# Patient Record
Sex: Female | Born: 2000
Health system: Southern US, Community
[De-identification: ages and names within clinical notes are randomized; demographics above are authoritative.]

## PROBLEM LIST (undated history)

## (undated) DIAGNOSIS — R55 Syncope and collapse: Secondary | ICD-10-CM

## (undated) DIAGNOSIS — F419 Anxiety disorder, unspecified: Secondary | ICD-10-CM

## (undated) DIAGNOSIS — R110 Nausea: Secondary | ICD-10-CM

## (undated) DIAGNOSIS — M419 Scoliosis, unspecified: Secondary | ICD-10-CM

## (undated) DIAGNOSIS — Z8489 Family history of other specified conditions: Secondary | ICD-10-CM

## (undated) DIAGNOSIS — R51 Headache: Secondary | ICD-10-CM

## (undated) DIAGNOSIS — R519 Headache, unspecified: Secondary | ICD-10-CM

## (undated) DIAGNOSIS — H539 Unspecified visual disturbance: Secondary | ICD-10-CM

## (undated) HISTORY — DX: Anxiety disorder, unspecified: F41.9

## (undated) HISTORY — DX: Syncope and collapse: R55

## (undated) HISTORY — DX: Nausea: R11.0

## (undated) HISTORY — PX: TYMPANOSTOMY TUBE PLACEMENT: SHX32

---
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
## 2020-03-06 ENCOUNTER — Telehealth (INDEPENDENT_AMBULATORY_CARE_PROVIDER_SITE_OTHER): Payer: 59 | Admitting: Psychiatry

## 2020-03-06 ENCOUNTER — Other Ambulatory Visit: Payer: Self-pay

## 2020-03-06 DIAGNOSIS — F411 Generalized anxiety disorder: Secondary | ICD-10-CM

## 2020-03-06 MED ORDER — CITALOPRAM HYDROBROMIDE 20 MG PO TABS: 30.0000 mg | ORAL_TABLET | Freq: Every day | ORAL | 1 refills | Status: DC

## 2020-03-06 NOTE — Progress Notes (Signed)
BH MD/PA/NP OP Progress Note  03/06/2020 11:07 AM Melissa Mcknight  MRN:  [id number] Interview was conducted by phone and I verified that I was speaking with the correct person using two identifiers. I discussed the limitations of evaluation and management by telemedicine and  the availability of in person appointments. Patient expressed understanding and agreed to proceed.Patient location - home; physican - home office.  Chief Complaint: "I am well".  HPI: 19 yo single white femalewitha hx of panic attacks and generalized/social anxiety. These likely contributed to tension headaches she experienced in the past but not anymore. Anxiety has been under good control for a while now after addition of citalopram. Laketta has been feeling stable andpreviouslyexpressed interest in coming off the medication.This however has changedafter she hasstarted [name] pre-medbut due to COVID-19 all courses were given on-line. In he Fall they will be in person. She is no longer in counseling and still feels that it is not the right time to come off citalopram. She denies feeling depressed and hersleep and appetite are good.    Visit Diagnosis:    ICD-10-CM   1. GAD (generalized anxiety disorder)  F41.1     Past Psychiatric History: Plse see itakp.  Past Medical History:  Past Medical History:  Diagnosis Date  . Family history of adverse reaction to anesthesia    MGM - N/V and hard time com,ing out off it  . Headache   . Nausea   . Scoliosis    miminal   . Vision abnormalities    wears glasses    Past Surgical History:  Procedure Laterality Date  . ESOPHAGOGASTRODUODENOSCOPY N/A 10/20/2016   Procedure: ESOPHAGOGASTRODUODENOSCOPY (EGD);  Surgeon: Adelene Amas, MD;  Location: [facility] ENDOSCOPY;  Service: Gastroenterology;  Laterality: N/A;  . TYMPANOSTOMY TUBE PLACEMENT      Family Psychiatric History: Reviewed.  Family History:  Family History  Problem Relation Age of Onset  .  Depression Mother   . Anxiety disorder Mother   . Hyperlipidemia Father   . Arthritis Maternal Grandmother   . Asthma Maternal Grandmother   . Depression Maternal Grandmother   . Diabetes Maternal Grandmother   . Hypertension Maternal Grandmother   . Anxiety disorder Maternal Grandmother   . Arthritis Maternal Grandfather   . COPD Maternal Grandfather   . Diabetes Maternal Grandfather   . Hearing loss Maternal Grandfather   . Heart disease Maternal Grandfather   . Arthritis Paternal Grandmother   . Hyperlipidemia Paternal Grandmother   . Arthritis Paternal Grandfather   . Diabetes Paternal Grandfather   . Heart disease Paternal Grandfather   . Hypertension Paternal Grandfather   . Anxiety disorder Paternal Aunt   . Anxiety disorder Cousin     Social History:  Social History   Socioeconomic History  . Marital status: Single    Spouse name: Not on file  . Number of children: 0  . Years of education: Not on file  . Highest education level: Not on file  Occupational History  . Occupation: Dentist: [employer]  Tobacco Use  . Smoking status: Never Smoker  . Smokeless tobacco: Never Used  Substance and Sexual Activity  . Alcohol use: No  . Drug use: No  . Sexual activity: Never  Other Topics Concern  . 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   She attends [employer].   She lives with both parents and her two sisters.  She enjoys her dogs, reading.   Social Determinants of Health   Financial Resource Strain:   . Difficulty of Paying Living Expenses:   Food Insecurity:   . Worried About Charity fundraiser in the Last Year:   . Arboriculturist in the Last Year:   Transportation Needs:   . Film/video editor (Medical):   Marland Kitchen Lack of Transportation (Non-Medical):   Physical Activity:   . Days of Exercise per Week:   . Minutes of Exercise per Session:   Stress:   . Feeling of Stress :   Social  Connections:   . Frequency of Communication with Friends and Family:   . Frequency of Social Gatherings with Friends and Family:   . Attends Religious Services:   . Active Member of Clubs or Organizations:   . Attends Archivist Meetings:   Marland Kitchen Marital Status:     Allergies: No Known Allergies  Metabolic Disorder Labs: No results found for: HGBA1C, MPG No results found for: PROLACTIN No results found for: CHOL, TRIG, HDL, CHOLHDL, VLDL, LDLCALC No results found for: TSH  Therapeutic Level Labs: No results found for: LITHIUM No results found for: VALPROATE No components found for:  CBMZ  Current Medications: Current Outpatient Medications  Medication Sig Dispense Refill  . [START ON 05/27/2020] citalopram (CELEXA) 20 MG tablet Take 1.5 tablets (30 mg total) by mouth daily. 135 tablet 1  . clindamycin-benzoyl peroxide (BENZACLIN) gel Apply topically 2 (two) times daily.     No current facility-administered medications for this visit.     Psychiatric Specialty Exam: Review of Systems  All other systems reviewed and are negative.   There were no vitals taken for this visit.There is no height or weight on file to calculate BMI.  General Appearance: NA  Eye Contact:  NA  Speech:  Clear and Coherent and Normal Rate  Volume:  Normal  Mood:  Euthymic  Affect:  NA  Thought Process:  Goal Directed and Linear  Orientation:  Full (Time, Place, and Person)  Thought Content: Logical   Suicidal Thoughts:  No  Homicidal Thoughts:  No  Memory:  Immediate;   Good Recent;   Good Remote;   Good  Judgement:  Good  Insight:  Good  Psychomotor Activity:  NA  Concentration:  Concentration: Good  Recall:  Good  Fund of Knowledge: Good  Language: Good  Akathisia:  Negative  Handed:  Right  AIMS (if indicated): not done  Assets:  Communication Skills Desire for Improvement Financial Resources/Insurance Housing Physical Health Social  Support Talents/Skills Vocational/Educational  ADL's:  Intact  Cognition: WNL  Sleep:  Good   Assessment and Plan:  19 yo single white femalewitha hx of panic attacks and generalized/social anxiety. These likely contributed to tension headaches she experienced in the past but not anymore. Anxiety has been under good control for a while now after addition of citalopram. Ticia has been feeling stable andpreviouslyexpressed interest in coming off the medication.This however has changedafter she hasstarted [employer] pre-medbut due to COVID-19 all courses were given on-line. In he Fall they will be in person. She is no longer in counseling and still feels that it is not the right time to come off citalopram. She denies feeling depressed and hersleep and appetite are good.   Dx: GAD  Plan: Continue citalopram 30 mg for now.We plan to have a next visit in 3 months.The plan was discussed with patient who had an opportunity to ask  questions and these were all answered.I spend15 minutes inphone consultation with the patient.    Magdalene Patricia, MD 03/06/2020, 11:07 AM

## 2020-06-07 ENCOUNTER — Other Ambulatory Visit: Payer: Self-pay

## 2020-06-07 ENCOUNTER — Telehealth (INDEPENDENT_AMBULATORY_CARE_PROVIDER_SITE_OTHER): Payer: 59 | Admitting: Psychiatry

## 2020-06-07 DIAGNOSIS — F411 Generalized anxiety disorder: Secondary | ICD-10-CM

## 2020-06-07 NOTE — Progress Notes (Signed)
BH MD/PA/NP OP Progress Note  06/07/2020 9:39 AM Melissa Mcknight  MRN:  [id number] Interview was conducted by phone and I verified that I was speaking with the correct person using two identifiers. I discussed the limitations of evaluation and management by telemedicine and  the availability of in person appointments. Patient expressed understanding and agreed to proceed. Patient location - home; physician - home office.  Chief Complaint: None.  HPI: 19 yo single white femalewitha hx of panic attacks and generalized/social anxiety. These likely contributed to tension headaches she experienced in the past but not anymore. Anxiety has been under good control for a while now after addition of citalopram. Melissa Mcknight has been feeling stable andpreviouslyexpressed interest in coming off the medication.This however has changedafter she hasstarted [name] pre-medbut due to COVID-19 all courses weregiven on-line. She is now a sophomore (to major  In biology), is in [employer] as most classes are held in person. She has been vaccinated against COVID and plans to get flue vaccine as well. She had brief increase in anxiety level at the beginning of semester but it "is normal now". She denies feeling depressed and hersleep and appetite are good.    Visit Diagnosis:    ICD-10-CM   1. GAD (generalized anxiety disorder)  F41.1     Past Psychiatric History: Please see intake H&P.  Past Medical History:  Past Medical History:  Diagnosis Date  . Family history of adverse reaction to anesthesia    MGM - N/V and hard time com,ing out off it  . Headache   . Nausea   . Scoliosis    miminal   . Vision abnormalities    wears glasses    Past Surgical History:  Procedure Laterality Date  . ESOPHAGOGASTRODUODENOSCOPY N/A 10/20/2016   Procedure: ESOPHAGOGASTRODUODENOSCOPY (EGD);  Surgeon: Adelene Amas, MD;  Location: [facility] ENDOSCOPY;  Service: Gastroenterology;  Laterality: N/A;  . TYMPANOSTOMY  TUBE PLACEMENT      Family Psychiatric History: Reviewed.  Family History:  Family History  Problem Relation Age of Onset  . Depression Mother   . Anxiety disorder Mother   . Hyperlipidemia Father   . Arthritis Maternal Grandmother   . Asthma Maternal Grandmother   . Depression Maternal Grandmother   . Diabetes Maternal Grandmother   . Hypertension Maternal Grandmother   . Anxiety disorder Maternal Grandmother   . Arthritis Maternal Grandfather   . COPD Maternal Grandfather   . Diabetes Maternal Grandfather   . Hearing loss Maternal Grandfather   . Heart disease Maternal Grandfather   . Arthritis Paternal Grandmother   . Hyperlipidemia Paternal Grandmother   . Arthritis Paternal Grandfather   . Diabetes Paternal Grandfather   . Heart disease Paternal Grandfather   . Hypertension Paternal Grandfather   . Anxiety disorder Paternal Aunt   . Anxiety disorder Cousin     Social History:  Social History   Socioeconomic History  . Marital status: Single    Spouse name: Not on file  . Number of children: 0  . Years of education: Not on file  . Highest education level: Not on file  Occupational History  . Occupation: Dentist: [employer]  Tobacco Use  . Smoking status: Never Smoker  . Smokeless tobacco: Never Used  Vaping Use  . Vaping Use: Never used  Substance and Sexual Activity  . Alcohol use: No  . Drug use: No  . Sexual activity: Never  Other Topics Concern  . 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   She attends [employer].   She lives with both parents and her two sisters.   She enjoys her dogs, reading.   Social Determinants of Health   Financial Resource Strain:   . Difficulty of Paying Living Expenses: Not on file  Food Insecurity:   . Worried About Programme researcher, broadcasting/film/video in the Last Year: Not on file  . Ran Out of Food in the Last Year: Not on file  Transportation Needs:   . Lack of  Transportation (Medical): Not on file  . Lack of Transportation (Non-Medical): Not on file  Physical Activity:   . Days of Exercise per Week: Not on file  . Minutes of Exercise per Session: Not on file  Stress:   . Feeling of Stress : Not on file  Social Connections:   . Frequency of Communication with Friends and Family: Not on file  . Frequency of Social Gatherings with Friends and Family: Not on file  . Attends Religious Services: Not on file  . Active Member of Clubs or Organizations: Not on file  . Attends Banker Meetings: Not on file  . Marital Status: Not on file    Allergies: No Known Allergies  Metabolic Disorder Labs: No results found for: HGBA1C, MPG No results found for: PROLACTIN No results found for: CHOL, TRIG, HDL, CHOLHDL, VLDL, LDLCALC No results found for: TSH  Therapeutic Level Labs: No results found for: LITHIUM No results found for: VALPROATE No components found for:  CBMZ  Current Medications: Current Outpatient Medications  Medication Sig Dispense Refill  . citalopram (CELEXA) 20 MG tablet Take 1.5 tablets (30 mg total) by mouth daily. 135 tablet 1  . clindamycin-benzoyl peroxide (BENZACLIN) gel Apply topically 2 (two) times daily.     No current facility-administered medications for this visit.    Psychiatric Specialty Exam: Review of Systems  All other systems reviewed and are negative.   There were no vitals taken for this visit.There is no height or weight on file to calculate BMI.  General Appearance: NA  Eye Contact:  NA  Speech:  Clear and Coherent and Normal Rate  Volume:  Normal  Mood:  Euthymic  Affect:  NA  Thought Process:  Goal Directed and Linear  Orientation:  Full (Time, Place, and Person)  Thought Content: Logical   Suicidal Thoughts:  No  Homicidal Thoughts:  No  Memory:  Immediate;   Good Recent;   Good Remote;   Good  Judgement:  Good  Insight:  Good  Psychomotor Activity:  NA  Concentration:   Concentration: Good and Attention Span: Good  Recall:  Good  Fund of Knowledge: Good  Language: Good  Akathisia:  Negative  Handed:  Right  AIMS (if indicated): not done  Assets:  Communication Skills Desire for Improvement Financial Resources/Insurance Housing Physical Health Social Support Vocational/Educational  ADL's:  Intact  Cognition: WNL  Sleep:  Good    Assessment and Plan: 19 yo single white femalewitha hx of panic attacks and generalized/social anxiety. These likely contributed to tension headaches she experienced in the past but not anymore. Anxiety has been under good control for a while now after addition of citalopram. Samira has been feeling stable andpreviouslyexpressed interest in coming off the medication.This however has changedafter she hasstarted [name] pre-medbut due to COVID-19 all courses weregiven on-line. She is now a sophomore (to major  In biology), is in  [place] as most classes are held in person. She has been vaccinated against COVID and plans to get flue vaccine as well. She had brief increase in anxiety level at the beginning of semester but it "is normal now". She denies feeling depressed and hersleep and appetite are good.   Dx: GAD  Plan: Continue citalopram 30 mg for now.We plan to have a next visit in 3 months.The plan was discussed with patient who had an opportunity to ask questions and these were all answered.I spend45minutes inphone consultation with the patient.   Magdalene Patricia, MD 06/07/2020, 9:39 AM

## 2020-09-10 ENCOUNTER — Telehealth (INDEPENDENT_AMBULATORY_CARE_PROVIDER_SITE_OTHER): Payer: 59 | Admitting: Psychiatry

## 2020-09-10 ENCOUNTER — Other Ambulatory Visit: Payer: Self-pay

## 2020-09-10 DIAGNOSIS — F411 Generalized anxiety disorder: Secondary | ICD-10-CM | POA: Diagnosis not present

## 2020-09-10 MED ORDER — CITALOPRAM HYDROBROMIDE 20 MG PO TABS: 30.0000 mg | ORAL_TABLET | Freq: Every day | ORAL | 1 refills | Status: DC

## 2020-09-10 NOTE — Progress Notes (Signed)
BH MD/PA/NP OP Progress Note  09/10/2020 9:37 AM Melissa Mcknight  MRN:  [id number] Interview was conducted by phone and I verified that I was speaking with the correct person using two identifiers. I discussed the limitations of evaluation and management by telemedicine and  the availability of in person appointments. Patient expressed understanding and agreed to proceed. Participants in the visit: patient (location - home); physician (location - home office).  Chief Complaint: "I am well".  HPI: 19yo single white femalewitha hx of panic attacks and generalized/social anxiety. These likely contributed to tension headaches she experienced in the past but not anymore. Anxiety has been under good control for a while now after addition of citalopram. Melissa Mcknight has been feeling stable andpreviouslyexpressed interest in coming off the medication.This however has changedafter she hasstarted [employer] where she is now a sophomore (to major in biology).  She had brief increase in anxiety level at the beginning of semester but it "is normal now". She denies feeling depressed and hersleep and appetite are good.    Visit Diagnosis:    ICD-10-CM   1. GAD (generalized anxiety disorder)  F41.1     Past Psychiatric History: Please see intake H&P.  Past Medical History:  Past Medical History:  Diagnosis Date  . Family history of adverse reaction to anesthesia    MGM - N/V and hard time com,ing out off it  . Headache   . Nausea   . Scoliosis    miminal   . Vision abnormalities    wears glasses    Past Surgical History:  Procedure Laterality Date  . ESOPHAGOGASTRODUODENOSCOPY N/A 10/20/2016   Procedure: ESOPHAGOGASTRODUODENOSCOPY (EGD);  Surgeon: Adelene Amas, MD;  Location[facility] ENDOSCOPY;  Service: Gastroenterology;  Laterality: N/A;  . TYMPANOSTOMY TUBE PLACEMENT      Family Psychiatric History: Reviewed.  Family History:  Family History  Problem Relation Age of Onset  .  Depression Mother   . Anxiety disorder Mother   . Hyperlipidemia Father   . Arthritis Maternal Grandmother   . Asthma Maternal Grandmother   . Depression Maternal Grandmother   . Diabetes Maternal Grandmother   . Hypertension Maternal Grandmother   . Anxiety disorder Maternal Grandmother   . Arthritis Maternal Grandfather   . COPD Maternal Grandfather   . Diabetes Maternal Grandfather   . Hearing loss Maternal Grandfather   . Heart disease Maternal Grandfather   . Arthritis Paternal Grandmother   . Hyperlipidemia Paternal Grandmother   . Arthritis Paternal Grandfather   . Diabetes Paternal Grandfather   . Heart disease Paternal Grandfather   . Hypertension Paternal Grandfather   . Anxiety disorder Paternal Aunt   . Anxiety disorder Cousin     Social History:  Social History   Socioeconomic History  . Marital status: Single    Spouse name: Not on file  . Number of children: 0  . Years of education: Not on file  . Highest education level: Not on file  Occupational History  . Occupation: Dentist: [employer]  Tobacco Use  . Smoking status: Never Smoker  . Smokeless tobacco: Never Used  Vaping Use  . Vaping Use: Never used  Substance and Sexual Activity  . Alcohol use: No  . Drug use: No  . Sexual activity: Never  Other Topics Concern  . 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   She attends [employer].   She lives with both  parents and her two sisters.   She enjoys her dogs, reading.   Social Determinants of Health   Financial Resource Strain: Not on file  Food Insecurity: Not on file  Transportation Needs: Not on file  Physical Activity: Not on file  Stress: Not on file  Social Connections: Not on file    Allergies: No Known Allergies  Metabolic Disorder Labs: No results found for: HGBA1C, MPG No results found for: PROLACTIN No results found for: CHOL, TRIG, HDL, CHOLHDL, VLDL, LDLCALC No  results found for: TSH  Therapeutic Level Labs: No results found for: LITHIUM No results found for: VALPROATE No components found for:  CBMZ  Current Medications: Current Outpatient Medications  Medication Sig Dispense Refill  . citalopram (CELEXA) 20 MG tablet Take 1.5 tablets (30 mg total) by mouth daily. 135 tablet 1  . clindamycin-benzoyl peroxide (BENZACLIN) gel Apply topically 2 (two) times daily.     No current facility-administered medications for this visit.    Psychiatric Specialty Exam: Review of Systems  All other systems reviewed and are negative.   There were no vitals taken for this visit.There is no height or weight on file to calculate BMI.  General Appearance: NA  Eye Contact:  NA  Speech:  Clear and Coherent and Normal Rate  Volume:  Normal  Mood:  Euthymic  Affect:  NA  Thought Process:  Goal Directed and Linear  Orientation:  Other:  x 4  Thought Content: Logical   Suicidal Thoughts:  No  Homicidal Thoughts:  No  Memory:  Immediate;   Good Recent;   Good Remote;   Good  Judgement:  Good  Insight:  Good  Psychomotor Activity:  NA  Concentration:  Concentration: Good and Attention Span: Good  Recall:  Good  Fund of Knowledge: Good  Language: Good  Akathisia:  Negative  Handed:  Right  AIMS (if indicated): not done  Assets:  Communication Skills Desire for Improvement Financial Resources/Insurance Housing Social Support Vocational/Educational  ADL's:  Intact  Cognition: WNL  Sleep:  Good    Assessment and Plan: 19yo single white femalewitha hx of panic attacks and generalized/social anxiety. These likely contributed to tension headaches she experienced in the past but not anymore. Anxiety has been under good control for a while now after addition of citalopram. Melissa Mcknight has been feeling stable andpreviouslyexpressed interest in coming off the medication.This however has changedafter she hasstarted [employer] where she is now  a sophomore (to major in biology).  She had brief increase in anxiety level at the beginning of semester but it "is normal now". She denies feeling depressed and hersleep and appetite are good.   Dx: GAD  Plan: Continue citalopram 30 mg.We plan to have a next visit in 3 months.The plan was discussed with patient who had an opportunity to ask questions and these were all answered.I spend19minutes inphone consultation with the patient.   Magdalene Patricia, MD 09/10/2020, 9:37 AM

## 2020-11-11 ENCOUNTER — Other Ambulatory Visit: Payer: Self-pay

## 2020-11-11 ENCOUNTER — Telehealth (INDEPENDENT_AMBULATORY_CARE_PROVIDER_SITE_OTHER): Payer: 59 | Admitting: Psychiatry

## 2020-11-11 DIAGNOSIS — F411 Generalized anxiety disorder: Secondary | ICD-10-CM

## 2020-11-11 MED ORDER — CITALOPRAM HYDROBROMIDE 20 MG PO TABS: 30.0000 mg | ORAL_TABLET | Freq: Every day | ORAL | 1 refills | Status: AC

## 2020-11-11 NOTE — Progress Notes (Signed)
BH MD/PA/NP OP Progress Note  11/11/2020 1:11 PM Melissa Mcknight  MRN:  [id number] Interview was conducted using videoconferencing application and I verified that I was speaking with the correct person using two identifiers. I discussed the limitations of evaluation and management by telemedicine and  the availability of in person appointments. Patient expressed understanding and agreed to proceed. Participants in the visit: patient (location - home); physician (location - home office).  Chief Complaint: None.  HPI: 19yo single white femalewitha hx of panic attacks and generalized/social anxiety. These likely contributed to tension headaches she experienced in the past but not anymore. Anxiety has been under good control for a while now after addition of citalopram. Melissa Mcknight has been feeling stable andpreviouslyexpressed interest in coming off the medication.This however has changedafter she hasstarted [employer] where she is now a sophomore (to major in biology).  She has brief periods of increased anxiety (1-2 days) in response to stressors at school - academic - but no significant academic problems. These seem to be normal adjustment reactions to stress.  She denies feeling depressed and hersleep and appetite are good.    Visit Diagnosis:    ICD-10-CM   1. GAD (generalized anxiety disorder)  F41.1     Past Psychiatric History: Please see intake H&P.  Past Medical History:  Past Medical History:  Diagnosis Date  . Family history of adverse reaction to anesthesia    MGM - N/V and hard time com,ing out off it  . Headache   . Nausea   . Scoliosis    miminal   . Vision abnormalities    wears glasses    Past Surgical History:  Procedure Laterality Date  . ESOPHAGOGASTRODUODENOSCOPY N/A 10/20/2016   Procedure: ESOPHAGOGASTRODUODENOSCOPY (EGD);  Surgeon: Adelene Amas, MD;  Location: [facility] ENDOSCOPY;  Service: Gastroenterology;  Laterality: N/A;  . TYMPANOSTOMY TUBE  PLACEMENT      Family Psychiatric History: Reviewed.  Family History:  Family History  Problem Relation Age of Onset  . Depression Mother   . Anxiety disorder Mother   . Hyperlipidemia Father   . Arthritis Maternal Grandmother   . Asthma Maternal Grandmother   . Depression Maternal Grandmother   . Diabetes Maternal Grandmother   . Hypertension Maternal Grandmother   . Anxiety disorder Maternal Grandmother   . Arthritis Maternal Grandfather   . COPD Maternal Grandfather   . Diabetes Maternal Grandfather   . Hearing loss Maternal Grandfather   . Heart disease Maternal Grandfather   . Arthritis Paternal Grandmother   . Hyperlipidemia Paternal Grandmother   . Arthritis Paternal Grandfather   . Diabetes Paternal Grandfather   . Heart disease Paternal Grandfather   . Hypertension Paternal Grandfather   . Anxiety disorder Paternal Aunt   . Anxiety disorder Cousin     Social History:  Social History   Socioeconomic History  . Marital status: Single    Spouse name: Not on file  . Number of children: 0  . Years of education: Not on file  . Highest education level: Not on file  Occupational History  . Occupation: Dentist: [employer]  Tobacco Use  . Smoking status: Never Smoker  . Smokeless tobacco: Never Used  Vaping Use  . Vaping Use: Never used  Substance and Sexual Activity  . Alcohol use: No  . Drug use: No  . Sexual activity: Never  Other Topics Concern  . Not on file  Social History Narrative   Keeley is a 12th grade  student.   She attends [employer].   She lives with both parents and her two sisters.   She enjoys her dogs, reading.   Social Determinants of Health   Financial Resource Strain: Not on file  Food Insecurity: Not on file  Transportation Needs: Not on file  Physical Activity: Not on file  Stress: Not on file  Social Connections: Not on file    Allergies: No Known Allergies  Metabolic Disorder Labs: No  results found for: HGBA1C, MPG No results found for: PROLACTIN No results found for: CHOL, TRIG, HDL, CHOLHDL, VLDL, LDLCALC No results found for: TSH  Therapeutic Level Labs: No results found for: LITHIUM No results found for: VALPROATE No components found for:  CBMZ  Current Medications: Current Outpatient Medications  Medication Sig Dispense Refill  . [START ON 03/09/2021] citalopram (CELEXA) 20 MG tablet Take 1.5 tablets (30 mg total) by mouth daily. 135 tablet 1  . clindamycin-benzoyl peroxide (BENZACLIN) gel Apply topically 2 (two) times daily.     No current facility-administered medications for this visit.      Psychiatric Specialty Exam: Review of Systems  All other systems reviewed and are negative.   There were no vitals taken for this visit.There is no height or weight on file to calculate BMI.  General Appearance: Casual and Well Groomed  Eye Contact:  Good  Speech:  Clear and Coherent and Normal Rate  Volume:  Normal  Mood:  Euthymic  Affect:  Full Range  Thought Process:  Goal Directed  Orientation:  Full (Time, Place, and Person)  Thought Content: Logical   Suicidal Thoughts:  No  Homicidal Thoughts:  No  Memory:  Immediate;   Good Recent;   Good Remote;   Good  Judgement:  Good  Insight:  Good  Psychomotor Activity:  Normal  Concentration:  Concentration: Good  Recall:  Good  Fund of Knowledge: Good  Language: Good  Akathisia:  Negative  Handed:  Right  AIMS (if indicated): not done  Assets:  Communication Skills Desire for Improvement Financial Resources/Insurance Housing Social Support Vocational/Educational  ADL's:  Intact  Cognition: WNL  Sleep:  Good    Assessment and Plan: 19yo single white femalewitha hx of panic attacks and generalized/social anxiety. These likely contributed to tension headaches she experienced in the past but not anymore. Anxiety has been under good control for a while now after addition of citalopram. Melissa Mcknight  has been feeling stable andpreviouslyexpressed interest in coming off the medication.This however has changedafter she hasstarted [employer] where she is now a sophomore (to major in biology).  She has brief periods of increased anxiety (1-2 days) in response to stressors at school - academic - but no significant academic problems. These seem to be normal adjustment reactions to stress.  She denies feeling depressed and hersleep and appetite are good.   Dx: GAD  Plan: Continue citalopram 30 mg.Next visit in 3 months with a new provider.The plan was discussed with patient who had an opportunity to ask questions and these were all answered.I spend30minutes invideo clinical contact with the patient.   Magdalene Patricia, MD 11/11/2020, 1:11 PM

## 2020-11-25 ENCOUNTER — Ambulatory Visit: Payer: 59 | Admitting: Cardiology

## 2020-11-25 ENCOUNTER — Other Ambulatory Visit: Payer: Self-pay

## 2020-11-25 ENCOUNTER — Encounter: Payer: Self-pay | Admitting: Cardiology

## 2020-11-25 VITALS — BP 127/86 | HR 96 | Ht 67.0 in | Wt 181.0 lb

## 2020-11-25 DIAGNOSIS — R55 Syncope and collapse: Secondary | ICD-10-CM

## 2020-11-25 NOTE — Progress Notes (Signed)
Date:  11/25/2020   ID:  [name], DOB March 08, 2001, MRN [id number]  PCP:  Elizabeth Palau, FNP  Cardiologist:  Tessa Lerner, DO, [facility] (established care 11/25/2020) Former Cardiology Providers: Dr. Caleen Essex.   REASON FOR CONSULT: Vasovagal, near syncope  REQUESTING PHYSICIAN:  Elizabeth Palau, FNP [address] [place],  [place] [address]  Chief Complaint  Patient presents with  . Near Syncope  . New Patient (Initial Visit)    HPI  Melissa Mcknight is a 20 y.o. female who presents to the office with a chief complaint of " near syncope." Patient's past medical history and cardiovascular risk factors include: Prior history of near syncope, prior diagnosis of vasovagal syncope.  She is referred to the office at the request of Elizabeth Palau, FNP for evaluation of vasovagal , near syncope.  Patient is accompanied by her mom Lela at today's office visit.  Patient provides verbal consent in regards to discussing her medical conditions and plan of care in the presence of her mom.  Her mom states that the patient was born healthy, no known cardiac conditions during her pediatric years, she used to play softball without any episodes of syncope.  No family history of premature coronary artery disease or sudden cardiac death.  Patient is currently a Consulting civil engineer at [employer] and has been experiencing at times episodes of " passing out."  Patient states that she would be in her normal state of health but at times has episodes when she starts experiencing diaphoresis, tunnel vision, increased heart rate, decreased blood pressure with a sensation that she is going to pass out but has never passed out/lost consciousness.  Patient states that she sits down rests or increases her water intake are alleviated.  Patient states that she drinks plenty of water on a daily basis, eats 3 balanced meals. No new medications, no use of supplements, no illicit drug use, no new over-the-counter  medications, no excessive bleeding during her periods, no use of stimulant drugs, does not consume energy drinks.  She states that her caffeine intake is minimal.  She consumes alcohol twice a month.  When asked if she has noticed any triggers she states that the symptoms usually are more frequent during her periods, after consuming alcohol, or drinking more than had regular dose of caffeinated beverages such as coffee.  And during the summer months when she gets hot patient states that she feels like " she is sick."  No prior episodes of syncopal events.  She has a younger sister who also has vasovagal syncope.  Patient states that she has had similar episodes while growing up and has seen a pediatric cardiologist back in 2019 at [facility].  At that time he was diagnosed with vasovagal syncope and no additional follow-up was performed.  She also had an echocardiogram during that time.  The records that are available through care everywhere were reviewed during today's encounter.  No family history of premature coronary disease or sudden cardiac death.  FUNCTIONAL STATUS: No organized activities but tries to walk as much as possible on campus.    ALLERGIES: No Known Allergies  MEDICATION LIST PRIOR TO VISIT: Current Meds  Medication Sig  . [START ON 03/09/2021] citalopram (CELEXA) 20 MG tablet Take 1.5 tablets (30 mg total) by mouth daily.     PAST MEDICAL HISTORY: Past Medical History:  Diagnosis Date  . Anxiety   . Family history of adverse reaction to anesthesia    MGM - N/V  and hard time com,ing out off it  . Headache   . Nausea   . Scoliosis    miminal   . Vision abnormalities    wears glasses    PAST SURGICAL HISTORY: Past Surgical History:  Procedure Laterality Date  . ESOPHAGOGASTRODUODENOSCOPY N/A 10/20/2016   Procedure: ESOPHAGOGASTRODUODENOSCOPY (EGD);  Surgeon: Adelene Amas, MD;  Location: [facility] ENDOSCOPY;  Service: Gastroenterology;  Laterality: N/A;  .  TYMPANOSTOMY TUBE PLACEMENT      FAMILY HISTORY: The patient family history includes Anxiety disorder in her cousin, maternal grandmother, mother, and paternal aunt; Arthritis in her maternal grandfather, maternal grandmother, paternal grandfather, and paternal grandmother; Asthma in her maternal grandmother; COPD in her maternal grandfather; Depression in her maternal grandmother and mother; Diabetes in her maternal grandfather, maternal grandmother, and paternal grandfather; Hearing loss in her maternal grandfather; Heart disease in her maternal grandfather and paternal grandfather; Hyperlipidemia in her father and paternal grandmother; Hypertension in her maternal grandmother and paternal grandfather.  SOCIAL HISTORY:  The patient  reports that she has never smoked. She has never used smokeless tobacco. She reports that she does not drink alcohol and does not use drugs.  REVIEW OF SYSTEMS: Review of Systems  Constitutional: Negative for chills and fever.  HENT: Negative for hoarse voice and nosebleeds.   Eyes: Negative for discharge, double vision and pain.  Cardiovascular: Positive for near-syncope. Negative for chest pain, claudication, dyspnea on exertion, leg swelling, orthopnea, palpitations, paroxysmal nocturnal dyspnea and syncope.  Respiratory: Negative for hemoptysis and shortness of breath.   Musculoskeletal: Negative for muscle cramps and myalgias.  Gastrointestinal: Negative for abdominal pain, constipation, diarrhea, hematemesis, hematochezia, melena, nausea and vomiting.  Neurological: Positive for numbness. Negative for loss of balance, seizures, tremors and vertigo.    PHYSICAL EXAM: Vitals with BMI 11/25/2020 04/21/2018 04/08/2018  Height 5\' 7"  - -  Weight 181 lbs - -  BMI 28.34 - -  Systolic 127 - -  Diastolic 86 - -  Pulse 96 108 74  Some encounter information is confidential and restricted. Go to Review Flowsheets activity to see all data.   Orthostatic VS for the  past 72 hrs (Last 3 readings):  Orthostatic BP Patient Position BP Location Cuff Size Orthostatic Pulse  11/25/20 1054 123/80 Standing Left Arm Normal 101  11/25/20 1053 123/76 Sitting Left Arm Normal 94  11/25/20 1052 115/78 Supine Left Arm Normal 78   CONSTITUTIONAL: Well-developed and well-nourished. No acute distress.  SKIN: Skin is warm and dry. No rash noted. No cyanosis. No pallor. No jaundice HEAD: Normocephalic and atraumatic.  EYES: No scleral icterus MOUTH/THROAT: Moist oral membranes.  NECK: No JVD present. No thyromegaly noted. No carotid bruits  LYMPHATIC: No visible cervical adenopathy.  CHEST Normal respiratory effort. No intercostal retractions  LUNGS: Clear to auscultation bilaterally.  No stridor. No wheezes. No rales.  CARDIOVASCULAR: Regular rate and rhythm, positive S1-S2, no murmurs rubs or gallops appreciated ABDOMINAL: No apparent ascites.  EXTREMITIES: No peripheral edema  HEMATOLOGIC: No significant bruising NEUROLOGIC: Oriented to person, place, and time. Nonfocal. Normal muscle tone.  PSYCHIATRIC: Normal mood and affect. Normal behavior. Cooperative  CARDIAC DATABASE: EKG:11/25/2020: Normal sinus rhythm, 77 bpm, normal axis, without underlying injury pattern.  Echocardiogram: [facility]:  11/25/2017 (care everywhere): 1. Trivial tricuspid valve regurgitation.  2. Normal left ventricular cavity size and systolic function.  3. The proximal coronary artery structures are normal.  4. Normal right ventricular cavity size and systolic function.  5. Right ventricular systolic pressure estimate =  15.4 mmHg.  6. Structurally normal heart   Normal echocardiogram.   Stress Testing: No results found for this or any previous visit from the past 1095 days.  Heart Catheterization: None  LABORATORY DATA:  External Labs: Collected: 11/08/2020 at [facility] health obtained from care everywhere through epic Hemoglobin 14.9 g/dL, hematocrit  34.1% Platelets 287 Serum creatinine 0.9 mg/dL. BUN 11. GFR >60 Sodium 141, potassium 4.6, chloride 104, bicarb 19, AST 25, ALT 17, alkaline phosphatase 131 (above normal limits)  IMPRESSION:    ICD-10-CM   1. Near syncope  R55 EKG 12-Lead    PCV ECHOCARDIOGRAM COMPLETE     RECOMMENDATIONS: Kaylah Chiasson is a 20 y.o. female whose past medical history and cardiac risk factors include: Prior history of near syncope, prior diagnosis of vasovagal syncope.  Near-syncope: Had a detailed discussion with both the patient and her mom during today's office visit and extensive review of her medical records which are available through care everywhere.  Patient's a symptoms based on history are very consistent with vasovagal etiology.  She has known triggers for these episodes as well.  She has never had a syncopal event as per patient.  No known family history of premature CAD, sudden cardiac death or cardiomyopathy.  Patient used to play softball growing up without any syncopal events.  Back in 2019 patient had seen pediatric cardiologist at [facility].  At that time an echocardiogram was performed results reviewed and noted above.  Besides keeping herself well-hydrated, and consuming 3 balanced meals patient is also educated on importance of avoiding her triggers as much as possible (I.e. excessive heat, alcohol consumption, anemia).  Outside labs from [facility] from 11/08/2020 reviewed.  Recommend that she follows up with her PCP and had a TSH checked as well.  She is recommended to change positions slowly, may increase salt in her diet if the symptoms resurface, and may also consider compression stockings.  Echocardiogram will be ordered to evaluate for structural heart disease and left ventricular systolic function.  I informed both the patient and her mom if the echocardiogram results are stable I will see them on as needed basis.  However if she is noted to have any significant  structural heart disease I will see her sooner and the office will reach out to her.  Patient also has nonspecific tingling in her extremities.  I have reassured the patient this is most likely noncardiac and that she should seek further medical attention by either seeing her neurologist or PCP.  FINAL MEDICATION LIST END OF ENCOUNTER: No orders of the defined types were placed in this encounter.   Medications Discontinued During This Encounter  Medication Reason  . clindamycin-benzoyl peroxide (BENZACLIN) gel Completed Course     Current Outpatient Medications:  .  [START ON 03/09/2021] citalopram (CELEXA) 20 MG tablet, Take 1.5 tablets (30 mg total) by mouth daily., Disp: 135 tablet, Rfl: 1  Orders Placed This Encounter  Procedures  . EKG 12-Lead  . PCV ECHOCARDIOGRAM COMPLETE    There are no Patient Instructions on file for this visit.   --Continue cardiac medications as reconciled in final medication list. --Return if symptoms worsen or fail to improve. Or sooner if needed. --Continue follow-up with your primary care physician regarding the management of your other chronic comorbid conditions.  Patient's questions and concerns were addressed to her satisfaction. She voices understanding of the instructions provided during this encounter.   This note was created using a voice recognition software as a result  there may be grammatical errors inadvertently enclosed that do not reflect the nature of this encounter. Every attempt is made to correct such errors.  Rex Kras, [place], [facility]  Pager: [phone] Office: [phone]

## 2020-11-26 ENCOUNTER — Ambulatory Visit (INDEPENDENT_AMBULATORY_CARE_PROVIDER_SITE_OTHER): Payer: 59 | Admitting: Gastroenterology

## 2020-11-26 ENCOUNTER — Encounter: Payer: Self-pay | Admitting: Gastroenterology

## 2020-11-26 ENCOUNTER — Telehealth: Payer: Self-pay | Admitting: Gastroenterology

## 2020-11-26 DIAGNOSIS — R1030 Lower abdominal pain, unspecified: Secondary | ICD-10-CM | POA: Diagnosis not present

## 2020-11-26 DIAGNOSIS — R109 Unspecified abdominal pain: Secondary | ICD-10-CM | POA: Insufficient documentation

## 2020-11-26 MED ORDER — HYOSCYAMINE SULFATE 0.125 MG SL SUBL: 0.1250 mg | SUBLINGUAL_TABLET | SUBLINGUAL | 1 refills | Status: DC | PRN

## 2020-11-26 NOTE — Patient Instructions (Signed)
Please have blood work done when you are able.  I have sent in Levsin to take under the tongue when you feel the pain coming on. This can be every 4 hours as needed. Do not take if it causes constipation. Some side effects are dry mouth, constipation.  In meantime, I would like for you to keep a food journal and bowel movement journal :) We will see if there are any precipitating foods or association with bowel habits.  I will see you virtually via a video visit in 4-6 weeks.   Please message me in the next 2 weeks with updates. If you are not better, we may need to do imaging.   It was a pleasure to see you today. I want to create trusting relationships with patients to provide genuine, compassionate, and quality care. I value your feedback. If you receive a survey regarding your visit,  I greatly appreciate you taking time to fill this out.   Gelene Mink, PhD, ANP-BC [facility]

## 2020-11-26 NOTE — Telephone Encounter (Signed)
If patient has not already had labs drawn, I ordered additional labs: CRP, sed rate, alpha gal panel.  I tried to call and update her but had to leave a message. If she calls back, can you let her know to complete this when she gets her other labs done?

## 2020-11-26 NOTE — H&P (View-Only) (Signed)
Primary Care Physician:  Vicenta Aly, [place]  Referring Physician: Vicenta Aly, FNP Primary Gastroenterologist:  Dr. Gala Romney (previously Dr. Joycelyn Rua with Pediatric GI)  Chief Complaint  Patient presents with  . Abdominal Pain    Across lower abd, severe pain about 3-4 times per week  . Diarrhea    Comes/goes    HPI:   Melissa Mcknight is a 20 y.o. female presenting today at the request of Vicenta Aly, FNP, due to abdominal pain and diarrhea. She was previously followed by Dr. Joycelyn Rua with peds GI for many years, undergoing EGD in 2018 with normal esophagus, normal stomach, patchy mucosal variant of duodenum. Path with slight chronic gastritis, duodenal biopsy with focal increased intraepithelial lymphocytes, not diagnostic of celiac sprue but query gluten restricted diet, mild celiac disease. Celiac serologies negative at that time.    Pain has gotten more frequent over past few months. Prior to this, pain would happen but not as severe. Eats gluten-free most of time. Will have searing pain across lower part of abdomen bad enough to want to vomit or pass out. Happens 2-3 times per week. Hasn't noticed anything that precipitated. Has been a good week this week. Will last about a minute of bad pain, on and off for a little bit but not long-standing. No constipation. Has woken up in middle of night and feels like she has to vomit or have diarrhea with pain. Tries to stay away from certain foods. Helps alleviate the pain to have the diarrhea or vomit. No rectal bleeding. BM at least once per day, sometimes more than once if anxious. Sometimes looser when this happens. Pain not associated with anxiety. No weight loss or lack of appetite. Sometimes feels nauseated after eating. No GERD symptoms. Regular periods. No recent ultrasounds. Doesn't feel constipated.   Tylenol prn.   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   Past Medical History:  Diagnosis  Date  . Anxiety   . Family history of adverse reaction to anesthesia    MGM - N/V and hard time com,ing out off it  . Headache   . Nausea   . Scoliosis    miminal   . Vasovagal syncope   . Vision abnormalities    wears glasses    Past Surgical History:  Procedure Laterality Date  . ESOPHAGOGASTRODUODENOSCOPY N/A 10/20/2016   Normal esophagus, normal stomach, patchy mucosal variant of duodenum. Path with slight chronic gastritis, duodenal biopsy with focal increased intraepithelial lymphocytes, not diagnostic of celiac sprue but query gluten restricted diet, mild celiac disease.   . TYMPANOSTOMY TUBE PLACEMENT      Current Outpatient Medications  Medication Sig Dispense Refill  . [START ON 03/09/2021] citalopram (CELEXA) 20 MG tablet Take 1.5 tablets (30 mg total) by mouth daily. (Patient taking differently: Take 20 mg by mouth daily.) 135 tablet 1  . hyoscyamine (LEVSIN SL) 0.125 MG SL tablet Place 1 tablet (0.125 mg total) under the tongue every 4 (four) hours as needed. For abdominal pain. Hold if constipation (Patient not taking: Reported on 12/03/2020) 30 tablet 1  . acetaminophen (TYLENOL) 500 MG tablet Take 1,000 mg by mouth every 8 (eight) hours as needed for moderate pain.    Marland Kitchen levonorgestrel-ethinyl estradiol (JOLESSA) 0.15-0.03 MG tablet Take 1 tablet by mouth daily.     No current facility-administered medications for this visit.    Allergies as of 11/26/2020  . (No Known Allergies)    Family History  Problem Relation Age of Onset  . Depression Mother   . Anxiety disorder Mother   . Hyperlipidemia Father   . Arthritis Maternal Grandmother   . Asthma Maternal Grandmother   . Depression Maternal Grandmother   . Diabetes Maternal Grandmother   . Hypertension Maternal Grandmother   . Anxiety disorder Maternal Grandmother   . Arthritis Maternal Grandfather   . COPD Maternal Grandfather   . Diabetes Maternal Grandfather   . Hearing loss Maternal Grandfather   . Heart  disease Maternal Grandfather   . Arthritis Paternal Grandmother   . Hyperlipidemia Paternal Grandmother   . Arthritis Paternal Grandfather   . Diabetes Paternal Grandfather   . Heart disease Paternal Grandfather   . Hypertension Paternal Grandfather   . Anxiety disorder Paternal Aunt   . Anxiety disorder Cousin   . Colon cancer Neg Hx   . Colon polyps Neg Hx     Social History   Socioeconomic History  . Marital status: Single    Spouse name: Not on file  . Number of children: 0  . Years of education: Not on file  . Highest education level: Not on file  Occupational History  . Occupation: Lexicographer: [employer]  Tobacco Use  . Smoking status: Never Smoker  . Smokeless tobacco: Never Used  Vaping Use  . Vaping Use: Never used  Substance and Sexual Activity  . Alcohol use: No  . Drug use: No  . Sexual activity: Never  Other Topics Concern  . Not on file  Social History Narrative   Student at [employer]    Social Determinants of Health   Financial Resource Strain: Not on file  Food Insecurity: Not on file  Transportation Needs: Not on file  Physical Activity: Not on file  Stress: Not on file  Social Connections: Not on file  Intimate Partner Violence: Not on file    Review of Systems: Gen: Denies any fever, chills, fatigue, weight loss, lack of appetite.  CV: Denies chest pain, heart palpitations, peripheral edema, syncope.  Resp: Denies shortness of breath at rest or with exertion. Denies wheezing or cough.  GI: see HPI GU : Denies urinary burning, urinary frequency, urinary hesitancy MS: Denies joint pain, muscle weakness, cramps, or limitation of movement.  Derm: Denies rash, itching, dry skin Psych: Denies depression, anxiety, memory loss, and confusion Heme: Denies bruising, bleeding, and enlarged lymph nodes.  Physical Exam: BP 138/81   Pulse 89   Temp 97.8 F (36.6 C)   Ht '5\' 7"'  (1.702 m)   Wt 181 lb 9.6 oz (82.4 kg)   LMP 11/12/2020   BMI  28.44 kg/m  General:   Alert and oriented. Pleasant and cooperative. Well-nourished and well-developed.  Head:  Normocephalic and atraumatic. Eyes:  Without icterus, sclera clear and conjunctiva pink.  Ears:  Normal auditory acuity. Mouth:  Mask in place Lungs:  Clear to auscultation bilaterally. No wheezes, rales, or rhonchi. No distress.  Heart:  S1, S2 present without murmurs appreciated.  Abdomen:  +BS, soft, non-tender and non-distended. No HSM noted. No guarding or rebound. No masses appreciated.  Rectal:  Deferred  Msk:  Symmetrical without gross deformities. Normal posture. Extremities:  Without edema. Neurologic:  Alert and  oriented x4;  grossly normal neurologically. Skin:  Intact without significant lesions or rashes. Psych:  Alert and cooperative. Normal mood and affect.  Outside Labs Feb 2022: Hgb 14.9, Hct 44.7, platelets 287. Tbili 0.2, Alk Phos 131, AST 25, ALT  53  ASSESSMENT: Melissa Mcknight is a 19 y.o. female presenting today with chronic abdominal pain, intermittent diarrhea, previously evaluated by Dr. Alease Frame with peds GI with some concern for celiac disease but celiac serologies negative. EGD at that time in 2018 with patchy mucosal variant of duodenum. Path with slight chronic gastritis, duodenal biopsy with focal increased intraepithelial lymphocytes, not diagnostic of celiac sprue but query gluten restricted diet, mild celiac disease. She was not labeled with celiac disease but advised to avoid gluten.  Currently student at [employer] and symptoms worsening. She does admit to eating gluten at times and is not entirely strict. We need to recheck serologies as now she has not been restricting gluten items. High suspicion for celiac disease. I have provided Levsin for cramping in the interim.   Mildly elevated alk phos: isolated elevated and normal transaminases. States she was not fasting. Will need to follow-up on this in several weeks with repeat fasting HFP.    PLAN: Celiac serologies Levsin prn Further recommendations to follow Follow-up on HFP at next appt fasting    Annitta Needs, PhD, ANP-BC [facility]    ADDENDUM: celiac serologies positive (TTg, IgA elevated at 20). Will pursue EGD with small bowel biopsies by Dr. Gala Romney using Propofol. Pregnancy screen prior. Discussed risks and benefits with stated understanding. Follow-up virtually thereafter (patient is a Ship broker at [employer]).   Annitta Needs, PhD, ANP-BC [facility]

## 2020-11-26 NOTE — Progress Notes (Signed)
Primary Care Physician:  Vicenta Aly, [place]  Referring Physician: Vicenta Aly, FNP Primary Gastroenterologist:  Dr. Gala Romney (previously Dr. Joycelyn Rua with Pediatric GI)  Chief Complaint  Patient presents with  . Abdominal Pain    Across lower abd, severe pain about 3-4 times per week  . Diarrhea    Comes/goes    HPI:   Melissa Mcknight is a 20 y.o. female presenting today at the request of Vicenta Aly, FNP, due to abdominal pain and diarrhea. She was previously followed by Dr. Joycelyn Rua with peds GI for many years, undergoing EGD in 2018 with normal esophagus, normal stomach, patchy mucosal variant of duodenum. Path with slight chronic gastritis, duodenal biopsy with focal increased intraepithelial lymphocytes, not diagnostic of celiac sprue but query gluten restricted diet, mild celiac disease. Celiac serologies negative at that time.    Pain has gotten more frequent over past few months. Prior to this, pain would happen but not as severe. Eats gluten-free most of time. Will have searing pain across lower part of abdomen bad enough to want to vomit or pass out. Happens 2-3 times per week. Hasn't noticed anything that precipitated. Has been a good week this week. Will last about a minute of bad pain, on and off for a little bit but not long-standing. No constipation. Has woken up in middle of night and feels like she has to vomit or have diarrhea with pain. Tries to stay away from certain foods. Helps alleviate the pain to have the diarrhea or vomit. No rectal bleeding. BM at least once per day, sometimes more than once if anxious. Sometimes looser when this happens. Pain not associated with anxiety. No weight loss or lack of appetite. Sometimes feels nauseated after eating. No GERD symptoms. Regular periods. No recent ultrasounds. Doesn't feel constipated.   Tylenol prn.   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   Past Medical History:  Diagnosis  Date  . Anxiety   . Family history of adverse reaction to anesthesia    MGM - N/V and hard time com,ing out off it  . Headache   . Nausea   . Scoliosis    miminal   . Vasovagal syncope   . Vision abnormalities    wears glasses    Past Surgical History:  Procedure Laterality Date  . ESOPHAGOGASTRODUODENOSCOPY N/A 10/20/2016   Normal esophagus, normal stomach, patchy mucosal variant of duodenum. Path with slight chronic gastritis, duodenal biopsy with focal increased intraepithelial lymphocytes, not diagnostic of celiac sprue but query gluten restricted diet, mild celiac disease.   . TYMPANOSTOMY TUBE PLACEMENT      Current Outpatient Medications  Medication Sig Dispense Refill  . [START ON 03/09/2021] citalopram (CELEXA) 20 MG tablet Take 1.5 tablets (30 mg total) by mouth daily. (Patient taking differently: Take 20 mg by mouth daily.) 135 tablet 1  . hyoscyamine (LEVSIN SL) 0.125 MG SL tablet Place 1 tablet (0.125 mg total) under the tongue every 4 (four) hours as needed. For abdominal pain. Hold if constipation (Patient not taking: Reported on 12/03/2020) 30 tablet 1  . acetaminophen (TYLENOL) 500 MG tablet Take 1,000 mg by mouth every 8 (eight) hours as needed for moderate pain.    Marland Kitchen levonorgestrel-ethinyl estradiol (JOLESSA) 0.15-0.03 MG tablet Take 1 tablet by mouth daily.     No current facility-administered medications for this visit.    Allergies as of 11/26/2020  . (No Known Allergies)    Family History  Problem Relation Age of Onset  . Depression Mother   . Anxiety disorder Mother   . Hyperlipidemia Father   . Arthritis Maternal Grandmother   . Asthma Maternal Grandmother   . Depression Maternal Grandmother   . Diabetes Maternal Grandmother   . Hypertension Maternal Grandmother   . Anxiety disorder Maternal Grandmother   . Arthritis Maternal Grandfather   . COPD Maternal Grandfather   . Diabetes Maternal Grandfather   . Hearing loss Maternal Grandfather   . Heart  disease Maternal Grandfather   . Arthritis Paternal Grandmother   . Hyperlipidemia Paternal Grandmother   . Arthritis Paternal Grandfather   . Diabetes Paternal Grandfather   . Heart disease Paternal Grandfather   . Hypertension Paternal Grandfather   . Anxiety disorder Paternal Aunt   . Anxiety disorder Cousin   . Colon cancer Neg Hx   . Colon polyps Neg Hx     Social History   Socioeconomic History  . Marital status: Single    Spouse name: Not on file  . Number of children: 0  . Years of education: Not on file  . Highest education level: Not on file  Occupational History  . Occupation: Lexicographer: [employer]  Tobacco Use  . Smoking status: Never Smoker  . Smokeless tobacco: Never Used  Vaping Use  . Vaping Use: Never used  Substance and Sexual Activity  . Alcohol use: No  . Drug use: No  . Sexual activity: Never  Other Topics Concern  . Not on file  Social History Narrative   Student at [employer]/    Social Determinants of Health   Financial Resource Strain: Not on file  Food Insecurity: Not on file  Transportation Needs: Not on file  Physical Activity: Not on file  Stress: Not on file  Social Connections: Not on file  Intimate Partner Violence: Not on file    Review of Systems: Gen: Denies any fever, chills, fatigue, weight loss, lack of appetite.  CV: Denies chest pain, heart palpitations, peripheral edema, syncope.  Resp: Denies shortness of breath at rest or with exertion. Denies wheezing or cough.  GI: see HPI GU : Denies urinary burning, urinary frequency, urinary hesitancy MS: Denies joint pain, muscle weakness, cramps, or limitation of movement.  Derm: Denies rash, itching, dry skin Psych: Denies depression, anxiety, memory loss, and confusion Heme: Denies bruising, bleeding, and enlarged lymph nodes.  Physical Exam: BP 138/81   Pulse 89   Temp 97.8 F (36.6 C)   Ht '5\' 7"'  (1.702 m)   Wt 181 lb 9.6 oz (82.4 kg)   LMP 11/12/2020   BMI  28.44 kg/m  General:   Alert and oriented. Pleasant and cooperative. Well-nourished and well-developed.  Head:  Normocephalic and atraumatic. Eyes:  Without icterus, sclera clear and conjunctiva pink.  Ears:  Normal auditory acuity. Mouth:  Mask in place Lungs:  Clear to auscultation bilaterally. No wheezes, rales, or rhonchi. No distress.  Heart:  S1, S2 present without murmurs appreciated.  Abdomen:  +BS, soft, non-tender and non-distended. No HSM noted. No guarding or rebound. No masses appreciated.  Rectal:  Deferred  Msk:  Symmetrical without gross deformities. Normal posture. Extremities:  Without edema. Neurologic:  Alert and  oriented x4;  grossly normal neurologically. Skin:  Intact without significant lesions or rashes. Psych:  Alert and cooperative. Normal mood and affect.  Outside Labs Feb 2022: Hgb 14.9, Hct 44.7, platelets 287. Tbili 0.2, Alk Phos 131, AST 25, ALT  45  ASSESSMENT: Melissa Mcknight is a 20 y.o. female presenting today with chronic abdominal pain, intermittent diarrhea, previously evaluated by Dr. Alease Frame with peds GI with some concern for celiac disease but celiac serologies negative. EGD at that time in 2018 with patchy mucosal variant of duodenum. Path with slight chronic gastritis, duodenal biopsy with focal increased intraepithelial lymphocytes, not diagnostic of celiac sprue but query gluten restricted diet, mild celiac disease. She was not labeled with celiac disease but advised to avoid gluten.  Currently student at [employer] and symptoms worsening. She does admit to eating gluten at times and is not entirely strict. We need to recheck serologies as now she has not been restricting gluten items. High suspicion for celiac disease. I have provided Levsin for cramping in the interim.   Mildly elevated alk phos: isolated elevated and normal transaminases. States she was not fasting. Will need to follow-up on this in several weeks with repeat fasting HFP.    PLAN: Celiac serologies Levsin prn Further recommendations to follow Follow-up on HFP at next appt fasting    Annitta Needs, PhD, ANP-BC [facility]    ADDENDUM: celiac serologies positive (TTg, IgA elevated at 20). Will pursue EGD with small bowel biopsies by Dr. Gala Romney using Propofol. Pregnancy screen prior. Discussed risks and benefits with stated understanding. Follow-up virtually thereafter (patient is a Ship broker at [employer]).   Annitta Needs, PhD, ANP-BC [facility]

## 2020-11-27 NOTE — Telephone Encounter (Signed)
Spoke with Lewie Loron, NP. Pt will complete labs at [facility]. Lab orders were placed and released.

## 2020-11-28 NOTE — Telephone Encounter (Signed)
From patient.

## 2020-12-02 ENCOUNTER — Encounter: Payer: Self-pay | Admitting: Gastroenterology

## 2020-12-02 LAB — ALPHA-GAL PANEL
Beef IgE: <0.1 kU/L (ref <0.35)
Class: 0
Class: 0
Class: 0
Galactose-alpha-1,3-galactose IgE: <0.1 kU/L (ref <0.10)
LAMB/MUTTON IGE: <0.1 kU/L (ref <0.35)
Pork IgE: <0.1 kU/L (ref <0.35)

## 2020-12-02 LAB — SEDIMENTATION RATE: Sed Rate: 2 mm/h (ref 0–20)

## 2020-12-02 LAB — C-REACTIVE PROTEIN: CRP: 1 mg/L (ref <8.0)

## 2020-12-02 LAB — TISSUE TRANSGLUTAMINASE, IGA: (tTG) Ab, IgA: 20.1 U/mL — ABNORMAL HIGH

## 2020-12-02 NOTE — Telephone Encounter (Signed)
Can you follow up with this please?

## 2020-12-03 ENCOUNTER — Encounter: Payer: Self-pay | Admitting: Gastroenterology

## 2020-12-03 ENCOUNTER — Other Ambulatory Visit: Payer: Self-pay

## 2020-12-03 NOTE — Telephone Encounter (Signed)
Okay, thanks for the update!

## 2020-12-03 NOTE — Telephone Encounter (Signed)
Spoke to patient this morning and she stated she went to her GI doctor and they told her she probably has celiac disease and they provided her with a note.

## 2020-12-04 NOTE — Progress Notes (Signed)
Cc'ed to pcp °

## 2020-12-09 ENCOUNTER — Other Ambulatory Visit: Payer: Self-pay

## 2020-12-09 ENCOUNTER — Ambulatory Visit: Payer: 59

## 2020-12-09 DIAGNOSIS — R55 Syncope and collapse: Secondary | ICD-10-CM

## 2020-12-10 ENCOUNTER — Other Ambulatory Visit (HOSPITAL_COMMUNITY)
Admission: RE | Admit: 2020-12-10 | Discharge: 2020-12-10 | Disposition: A | Discharge status: Home / Self Care | Payer: 59 | Source: Ambulatory Visit | Attending: Internal Medicine | Admitting: Internal Medicine

## 2020-12-10 DIAGNOSIS — Z20822 Contact with and (suspected) exposure to covid-19: Secondary | ICD-10-CM | POA: Insufficient documentation

## 2020-12-10 DIAGNOSIS — Z01812 Encounter for preprocedural laboratory examination: Secondary | ICD-10-CM | POA: Diagnosis not present

## 2020-12-11 LAB — SARS CORONAVIRUS 2 (TAT 6-24 HRS): SARS Coronavirus 2: NEGATIVE

## 2020-12-12 ENCOUNTER — Ambulatory Visit (HOSPITAL_COMMUNITY)
Admission: RE | Admit: 2020-12-12 | Discharge: 2020-12-12 | Disposition: A | Discharge status: Home / Self Care | Payer: 59 | Attending: Internal Medicine | Admitting: Internal Medicine

## 2020-12-12 ENCOUNTER — Encounter (HOSPITAL_COMMUNITY): Payer: Self-pay | Admitting: Internal Medicine

## 2020-12-12 ENCOUNTER — Other Ambulatory Visit: Payer: Self-pay

## 2020-12-12 ENCOUNTER — Encounter (HOSPITAL_COMMUNITY)
Admission: RE | Disposition: A | Discharge status: Home / Self Care | Payer: Self-pay | Source: Home / Self Care | Attending: Internal Medicine

## 2020-12-12 ENCOUNTER — Ambulatory Visit (HOSPITAL_COMMUNITY): Payer: 59 | Admitting: Anesthesiology

## 2020-12-12 DIAGNOSIS — Z818 Family history of other mental and behavioral disorders: Secondary | ICD-10-CM | POA: Diagnosis not present

## 2020-12-12 DIAGNOSIS — Z8249 Family history of ischemic heart disease and other diseases of the circulatory system: Secondary | ICD-10-CM | POA: Diagnosis not present

## 2020-12-12 DIAGNOSIS — Z825 Family history of asthma and other chronic lower respiratory diseases: Secondary | ICD-10-CM | POA: Diagnosis not present

## 2020-12-12 DIAGNOSIS — Z793 Long term (current) use of hormonal contraceptives: Secondary | ICD-10-CM | POA: Insufficient documentation

## 2020-12-12 DIAGNOSIS — R768 Other specified abnormal immunological findings in serum: Secondary | ICD-10-CM | POA: Diagnosis not present

## 2020-12-12 DIAGNOSIS — K900 Celiac disease: Secondary | ICD-10-CM | POA: Insufficient documentation

## 2020-12-12 DIAGNOSIS — Z8349 Family history of other endocrine, nutritional and metabolic diseases: Secondary | ICD-10-CM | POA: Insufficient documentation

## 2020-12-12 DIAGNOSIS — Z79899 Other long term (current) drug therapy: Secondary | ICD-10-CM | POA: Diagnosis not present

## 2020-12-12 DIAGNOSIS — Z8261 Family history of arthritis: Secondary | ICD-10-CM | POA: Insufficient documentation

## 2020-12-12 DIAGNOSIS — Z833 Family history of diabetes mellitus: Secondary | ICD-10-CM | POA: Insufficient documentation

## 2020-12-12 HISTORY — PX: ESOPHAGOGASTRODUODENOSCOPY (EGD) WITH PROPOFOL: SHX5813

## 2020-12-12 HISTORY — PX: BIOPSY: SHX5522

## 2020-12-12 LAB — PREGNANCY, URINE: Preg Test, Ur: NEGATIVE

## 2020-12-12 SURGERY — ESOPHAGOGASTRODUODENOSCOPY (EGD) WITH PROPOFOL
Anesthesia: General

## 2020-12-12 MED ORDER — LIDOCAINE HCL (CARDIAC) PF 100 MG/5ML IV SOSY
PREFILLED_SYRINGE | INTRAVENOUS | Status: DC | PRN
  Administered 2020-12-12: 50 mg via INTRAVENOUS

## 2020-12-12 MED ORDER — LACTATED RINGERS IV SOLN: INTRAVENOUS | Status: DC

## 2020-12-12 MED ORDER — DEXMEDETOMIDINE (PRECEDEX) IN NS 20 MCG/5ML (4 MCG/ML) IV SYRINGE
PREFILLED_SYRINGE | INTRAVENOUS | Status: DC | PRN
  Administered 2020-12-12: 20 ug via INTRAVENOUS

## 2020-12-12 MED ORDER — STERILE WATER FOR IRRIGATION IR SOLN
Status: DC | PRN
  Administered 2020-12-12: 100 mL

## 2020-12-12 MED ORDER — DEXMEDETOMIDINE (PRECEDEX) IN NS 20 MCG/5ML (4 MCG/ML) IV SYRINGE
PREFILLED_SYRINGE | INTRAVENOUS | Status: AC
  Filled 2020-12-12: qty 5

## 2020-12-12 MED ORDER — PROPOFOL 500 MG/50ML IV EMUL
INTRAVENOUS | Status: DC | PRN
  Administered 2020-12-12: 150 ug/kg/min via INTRAVENOUS

## 2020-12-12 MED ORDER — PROPOFOL 10 MG/ML IV BOLUS
INTRAVENOUS | Status: DC | PRN
  Administered 2020-12-12: 130 mg via INTRAVENOUS
  Administered 2020-12-12: 70 mg via INTRAVENOUS

## 2020-12-12 NOTE — Discharge Instructions (Signed)
EGD Discharge instructions Please read the instructions outlined below and refer to this sheet in the next few weeks. These discharge instructions provide you with general information on caring for yourself after you leave the hospital. Your doctor may also give you specific instructions. While your treatment has been planned according to the most current medical practices available, unavoidable complications occasionally occur. If you have any problems or questions after discharge, please call your doctor. ACTIVITY  You may resume your regular activity but move at a slower pace for the next 24 hours.   Take frequent rest periods for the next 24 hours.   Walking will help expel (get rid of) the air and reduce the bloated feeling in your abdomen.   No driving for 24 hours (because of the anesthesia (medicine) used during the test).   You may shower.   Do not sign any important legal documents or operate any machinery for 24 hours (because of the anesthesia used during the test).  NUTRITION  Drink plenty of fluids.   You may resume your normal diet.   Begin with a light meal and progress to your normal diet.   Avoid alcoholic beverages for 24 hours or as instructed by your caregiver.  MEDICATIONS  You may resume your normal medications unless your caregiver tells you otherwise.  WHAT YOU CAN EXPECT TODAY  You may experience abdominal discomfort such as a feeling of fullness or "gas" pains.  FOLLOW-UP  Your doctor will discuss the results of your test with you.  SEEK IMMEDIATE MEDICAL ATTENTION IF ANY OF THE FOLLOWING OCCUR:  Excessive nausea (feeling sick to your stomach) and/or vomiting.   Severe abdominal pain and distention (swelling).   Trouble swallowing.   Temperature over 101 F (37.8 C).   Rectal bleeding or vomiting of blood.    Your upper endoscopy appeared normal today.  Multiple biopsies of your small intestine (duodenum) taken  Further recommendations to  follow in the near future once I get the biopsies back for review  Office visit with [place] Lewie Loron in 3 months  At patient request, I called Brooks Stotz at [phone] -got voicemail.  Left a message.

## 2020-12-12 NOTE — Transfer of Care (Signed)
Immediate Anesthesia Transfer of Care Note  Patient: Melissa Mcknight  Procedure(s) Performed: ESOPHAGOGASTRODUODENOSCOPY (EGD) WITH PROPOFOL (N/A ) BIOPSY  Patient Location: Endoscopy Unit  Anesthesia Type:General  Level of Consciousness: drowsy  Airway & Oxygen Therapy: Patient Spontanous Breathing  Post-op Assessment: Report given to RN and Post -op Vital signs reviewed and stable  Post vital signs: Reviewed and stable  Last Vitals:  Vitals Value Taken Time  BP 89/45 12/12/20 1330  Temp 36.9 C 12/12/20 1330  Pulse 62 12/12/20 1330  Resp 18 12/12/20 1330  SpO2 100 % 12/12/20 1330    Last Pain:  Vitals:   12/12/20 1330  TempSrc: Axillary  PainSc: 0-No pain      Patients Stated Pain Goal: 7 (12/12/20 1043)  Complications: No complications documented.

## 2020-12-12 NOTE — Interval H&P Note (Signed)
History and Physical Interval Note:  12/12/2020 1:06 PM  Melissa Mcknight  has presented today for surgery, with the diagnosis of positive celiac serologies, need biopsy for diagnostic purposes.  The various methods of treatment have been discussed with the patient and family. After consideration of risks, benefits and other options for treatment, the patient has consented to  Procedure(s) with comments: ESOPHAGOGASTRODUODENOSCOPY (EGD) WITH PROPOFOL (N/A) - PM as a surgical intervention.  The patient's history has been reviewed, patient examined, no change in status, stable for surgery.  I have reviewed the patient's chart and labs.  Questions were answered to the patient's satisfaction.     Melissa Mcknight   No change.  Denies dysphagia.  EGD with small bowel biopsies per plan. The risks, benefits, limitations, alternatives and imponderables have been reviewed with the patient. Potential for esophageal dilation, biopsy, etc. have also been reviewed.  Questions have been answered. All parties agreeable.

## 2020-12-12 NOTE — Anesthesia Procedure Notes (Signed)
Date/Time: 12/12/2020 1:22 PM Performed by: Julian Reil, CRNA Pre-anesthesia Checklist: Patient identified, Emergency Drugs available, Suction available and Patient being monitored Patient Re-evaluated:Patient Re-evaluated prior to induction Oxygen Delivery Method: Nasal cannula Induction Type: IV induction Placement Confirmation: positive ETCO2

## 2020-12-12 NOTE — Anesthesia Postprocedure Evaluation (Signed)
Anesthesia Post Note  Patient: Melissa Mcknight  Procedure(s) Performed: ESOPHAGOGASTRODUODENOSCOPY (EGD) WITH PROPOFOL (N/A ) BIOPSY  Patient location during evaluation: Endoscopy Anesthesia Type: General Level of consciousness: awake and alert and oriented Pain management: pain level controlled Vital Signs Assessment: post-procedure vital signs reviewed and stable Respiratory status: spontaneous breathing and respiratory function stable Cardiovascular status: blood pressure returned to baseline and stable Postop Assessment: no apparent nausea or vomiting Anesthetic complications: no   No complications documented.   Last Vitals:  Vitals:   12/12/20 1333 12/12/20 1338  BP: (!) 91/53 98/60  Pulse: (!) 56 (!) 53  Resp: 20 20  Temp:    SpO2: 96% 96%    Last Pain:  Vitals:   12/12/20 1338  TempSrc:   PainSc: 0-No pain                 Mong Neal C Arnelle Nale

## 2020-12-12 NOTE — Op Note (Signed)
[facility] Patient Name: Melissa Mcknight Procedure Date: 12/12/2020 12:05 PM MRN: [id number] Date of Birth: 2001-09-20 Attending MD: Gennette Pac , MD CSN: [id number] Age: 20 Admit Type: Outpatient Procedure:                Upper GI endoscopy Indications:              Positive celiac serologies Providers:                Gennette Pac, MD, Sheran Fava,                            Pandora Leiter, Technician Referring MD:              Medicines:                Propofol per Anesthesia Complications:            No immediate complications. Estimated Blood Loss:     Estimated blood loss was minimal. Procedure:                Pre-Anesthesia Assessment:                           - Prior to the procedure, a History and Physical                            was performed, and patient medications and                            allergies were reviewed. The patient's tolerance of                            previous anesthesia was also reviewed. The risks                            and benefits of the procedure and the sedation                            options and risks were discussed with the patient.                            All questions were answered, and informed consent                            was obtained. Prior Anticoagulants: The patient has                            taken no previous anticoagulant or antiplatelet                            agents. ASA Grade Assessment: II - A patient with                            mild systemic disease. After reviewing the risks  and benefits, the patient was deemed in                            satisfactory condition to undergo the procedure.                           After obtaining informed consent, the endoscope was                            passed under direct vision. Throughout the                            procedure, the patient's blood pressure, pulse, and                            oxygen  saturations were monitored continuously. The                            [id number]-H190 ([id number]) scope was introduced through the                            mouth, and advanced to the third part of duodenum.                            The upper GI endoscopy was accomplished without                            difficulty. The patient tolerated the procedure                            well. Scope In: 1:16:49 PM Scope Out: 1:22:49 PM Total Procedure Duration: 0 hours 6 minutes 0 seconds  Findings:      The examined esophagus was normal.      The entire examined stomach was normal.      The duodenal bulb, second portion of the duodenum and third portion of       the duodenum were normal. Multiple biopsies of the bulb, second and       third portion of the duodenum taken for histologic study. Impression:               - Normal esophagus.                           - Normal stomach.                           - Normal duodenal bulb, second portion of the                            duodenum and third portion of the duodenum.                           - Status post duodenal biopsy; positive TTG  antibody compelling for celiac disease.                            Morphologically, small bowel appeared                            endoscopically normal but patient has been trying                            to adhere to a gluten-free diet. Moderate Sedation:      Moderate (conscious) sedation was personally administered by an       anesthesia professional. The following parameters were monitored: oxygen       saturation, heart rate, blood pressure, respiratory rate, EKG, adequacy       of pulmonary ventilation, and response to care. Recommendation:           - Patient has a contact number available for                            emergencies. The signs and symptoms of potential                            delayed complications were discussed with the                             patient. Return to normal activities tomorrow.                            Written discharge instructions were provided to the                            patient.                           - Advance diet as tolerated. Follow-up on                            pathology. Office visit with [place] in 3 months. Procedure Code(s):        --- Professional ---                           [phone], Esophagogastroduodenoscopy, flexible,                            transoral; diagnostic, including collection of                            specimen(s) by brushing or washing, when performed                            (separate procedure) Diagnosis Code(s):        --- Professional ---                           R76.8, Other specified abnormal immunological  findings in serum CPT copyright 2019 American [employer] Association. All rights reserved. The codes documented in this report are preliminary and upon coder review may  be revised to meet current compliance requirements. Gerrit Friendsobert M. Juley Giovanetti, MD Gennette Pacobert Michael Advika Mclelland, MD 12/12/2020 1:30:04 PM This report has been signed electronically. Number of Addenda: 0

## 2020-12-12 NOTE — Anesthesia Preprocedure Evaluation (Addendum)
Anesthesia Evaluation  Patient identified by MRN, date of birth, ID band Patient awake    Reviewed: Allergy & Precautions, NPO status , Patient's Chart, lab work & pertinent test results  History of Anesthesia Complications (+) Family history of anesthesia reaction and history of anesthetic complications  Airway Mallampati: II  TM Distance: >3 FB Neck ROM: Full    Dental  (+) Dental Advisory Given, Teeth Intact   Pulmonary neg pulmonary ROS,    Pulmonary exam normal breath sounds clear to auscultation       Cardiovascular Exercise Tolerance: Good Normal cardiovascular exam Rhythm:Regular Rate:Normal     Neuro/Psych  Headaches, PSYCHIATRIC DISORDERS Anxiety    GI/Hepatic negative GI ROS, Neg liver ROS,   Endo/Other  negative endocrine ROS  Renal/GU negative Renal ROS     Musculoskeletal negative musculoskeletal ROS (+)   Abdominal   Peds  Hematology negative hematology ROS (+)   Anesthesia Other Findings Vasovagal syncope  Reproductive/Obstetrics negative OB ROS                            Anesthesia Physical Anesthesia Plan  ASA: II  Anesthesia Plan: General   Post-op Pain Management:    Induction: Intravenous  PONV Risk Score and Plan: Propofol infusion  Airway Management Planned: Nasal Cannula and Natural Airway  Additional Equipment:   Intra-op Plan:   Post-operative Plan:   Informed Consent: I have reviewed the patients History and Physical, chart, labs and discussed the procedure including the risks, benefits and alternatives for the proposed anesthesia with the patient or authorized representative who has indicated his/her understanding and acceptance.     Dental advisory given  Plan Discussed with: CRNA and Surgeon  Anesthesia Plan Comments:         Anesthesia Quick Evaluation

## 2020-12-13 ENCOUNTER — Encounter: Payer: Self-pay | Admitting: Internal Medicine

## 2020-12-13 LAB — SURGICAL PATHOLOGY

## 2020-12-16 ENCOUNTER — Telehealth: Payer: Self-pay

## 2020-12-16 DIAGNOSIS — K900 Celiac disease: Secondary | ICD-10-CM

## 2020-12-16 NOTE — Telephone Encounter (Signed)
Noted. Letter mailed to pt. 

## 2020-12-16 NOTE — Telephone Encounter (Signed)
Per Dr.Rourk- Send letter to patient.  Send copy of letter with path to referring provider and PCP.   Patient needs a consultation with nutritionist/dietitian regarding gluten-free diet.   Office visit with [place] in 8 weeks

## 2020-12-16 NOTE — Addendum Note (Signed)
Addended by: Armstead Peaks on: 12/16/2020 10:56 AM   Modules accepted: Orders

## 2020-12-16 NOTE — Telephone Encounter (Signed)
Referral placed.

## 2020-12-17 NOTE — Telephone Encounter (Signed)
OV made °

## 2020-12-20 ENCOUNTER — Encounter (HOSPITAL_COMMUNITY): Payer: Self-pay | Admitting: Internal Medicine

## 2021-01-14 ENCOUNTER — Telehealth: Payer: 59 | Admitting: Gastroenterology

## 2021-01-20 ENCOUNTER — Ambulatory Visit: Payer: 59 | Admitting: Nutrition

## 2021-01-20 ENCOUNTER — Telehealth: Payer: Self-pay | Admitting: Nutrition

## 2021-02-06 NOTE — Telephone Encounter (Signed)
TC for no show. Message left to call back to reschedule missed appointment.

## 2021-03-18 ENCOUNTER — Encounter: Payer: Self-pay | Admitting: Gastroenterology

## 2021-03-18 ENCOUNTER — Ambulatory Visit (INDEPENDENT_AMBULATORY_CARE_PROVIDER_SITE_OTHER): Payer: 59 | Admitting: Gastroenterology

## 2021-03-18 ENCOUNTER — Other Ambulatory Visit: Payer: Self-pay

## 2021-03-18 DIAGNOSIS — K900 Celiac disease: Secondary | ICD-10-CM | POA: Insufficient documentation

## 2021-03-18 NOTE — Patient Instructions (Signed)
Continue the great work on strict gluten avoidance! I am ordering labs to check vitamins as celiac disease can cause malabsorption of essential vitamins you need.   Let me know if you are good with pursuing the bone scan. This would be a baseline to check bone density. I am sure you have excellent bone strength, but celiac disease does put you at risk for those vitamin deficiencies that could lead to bone loss over time.   We will see you in 6 months! You can also encourage first-degree relatives (ex: siblings) to be screened for celiac disease.    I enjoyed seeing you again today! As you know, I value our relationship and want to provide genuine, compassionate, and quality care. I welcome your feedback. If you receive a survey regarding your visit,  I greatly appreciate you taking time to fill this out. See you next time!  Gelene Mink, PhD, ANP-BC [facility]

## 2021-03-18 NOTE — Progress Notes (Signed)
Referring Provider: Elizabeth Palau, FNP Primary Care Physician:  Elizabeth Palau, FNP Primary GI: Dr. Jena Gauss   Chief Complaint  Patient presents with   Celiac Disease    F/u. No recent abd pain    HPI:   Melissa Mcknight is a 20 y.o. female presenting today with a history of abdominal pain and diarrhea, found to have elevated TTg, IgA and then EGD with small bowel biopsies consistent with celiac disease.   She returns today in follow-up, doing remarkably well. She has been avoiding gluten strictly. If she inadvertently ingests this, she will notice abdominal pain and looser stool. Otherwise, this has resolved. Good appetite. Doing well at [facility], now home for the summer.     Past Medical History:  Diagnosis Date   Anxiety    Family history of adverse reaction to anesthesia    MGM - N/V and hard time com,ing out off it   Headache    Nausea    Scoliosis    miminal    Vasovagal syncope    Vision abnormalities    wears glasses    Past Surgical History:  Procedure Laterality Date   BIOPSY  12/12/2020   Procedure: BIOPSY;  Surgeon: Corbin Ade, MD;  Location: [facility];  Service: Endoscopy;;   ESOPHAGOGASTRODUODENOSCOPY N/A 10/20/2016   Normal esophagus, normal stomach, patchy mucosal variant of duodenum. Path with slight chronic gastritis, duodenal biopsy with focal increased intraepithelial lymphocytes, not diagnostic of celiac sprue but query gluten restricted diet, mild celiac disease.    ESOPHAGOGASTRODUODENOSCOPY (EGD) WITH PROPOFOL N/A 12/12/2020   normal esophagus, stomach, duodenal bulb, s/p biopsy with pathology showing mild increase in intraepithelial lymphocytes with patchy moderate villous blunting consistent with celiac disease.   TYMPANOSTOMY TUBE PLACEMENT      Current Outpatient Medications  Medication Sig Dispense Refill   acetaminophen (TYLENOL) 500 MG tablet Take 1,000 mg by mouth every 8 (eight) hours as needed for moderate pain.      citalopram (CELEXA) 20 MG tablet Take 1.5 tablets (30 mg total) by mouth daily. (Patient taking differently: Take 20 mg by mouth daily.) 135 tablet 1   levonorgestrel-ethinyl estradiol (SEASONALE) 0.15-0.03 MG tablet Take 1 tablet by mouth daily.     No current facility-administered medications for this visit.    Allergies as of 03/18/2021   (No Known Allergies)    Family History  Problem Relation Age of Onset   Depression Mother    Anxiety disorder Mother    Hyperlipidemia Father    Arthritis Maternal Grandmother    Asthma Maternal Grandmother    Depression Maternal Grandmother    Diabetes Maternal Grandmother    Hypertension Maternal Grandmother    Anxiety disorder Maternal Grandmother    Arthritis Maternal Grandfather    COPD Maternal Grandfather    Diabetes Maternal Grandfather    Hearing loss Maternal Grandfather    Heart disease Maternal Grandfather    Arthritis Paternal Grandmother    Hyperlipidemia Paternal Grandmother    Arthritis Paternal Grandfather    Diabetes Paternal Grandfather    Heart disease Paternal Grandfather    Hypertension Paternal Grandfather    Anxiety disorder Paternal Aunt    Anxiety disorder Cousin    Colon cancer Neg Hx    Colon polyps Neg Hx     Social History   Socioeconomic History   Marital status: Single    Spouse name: Not on file   Number of children: 0   Years of  education: Not on file   Highest education level: Not on file  Occupational History   Occupation: Dentist: [employer]  Tobacco Use   Smoking status: Never   Smokeless tobacco: Never  Vaping Use   Vaping Use: Never used  Substance and Sexual Activity   Alcohol use: Yes    Comment: twice a month   Drug use: No   Sexual activity: Never  Other Topics Concern   Not on file  Social History Narrative   Student at [employer]    Social Determinants of Health   Financial Resource Strain: Not on file  Food Insecurity: Not on file  Transportation Needs: Not  on file  Physical Activity: Not on file  Stress: Not on file  Social Connections: Not on file    Review of Systems: Gen: Denies fever, chills, anorexia. Denies fatigue, weakness, weight loss.  CV: Denies chest pain, palpitations, syncope, peripheral edema, and claudication. Resp: Denies dyspnea at rest, cough, wheezing, coughing up blood, and pleurisy. GI: see HPI Derm: Denies rash, itching, dry skin Psych: Denies depression, anxiety, memory loss, confusion. No homicidal or suicidal ideation.  Heme: Denies bruising, bleeding, and enlarged lymph nodes.  Physical Exam: BP (!) 141/85   Pulse 98   Temp (!) 97.5 F (36.4 C)   Ht 5\' 7"  (1.702 m)   Wt 178 lb 3.2 oz (80.8 kg)   LMP 03/04/2021 (Approximate)   BMI 27.91 kg/m  General:   Alert and oriented. No distress noted. Pleasant and cooperative.  Head:  Normocephalic and atraumatic. Eyes:  Conjuctiva clear without scleral icterus. Mouth:  mask in place Abdomen:  +BS, soft, non-tender and non-distended. No rebound or guarding. No HSM or masses noted. Msk:  Symmetrical without gross deformities. Normal posture. Extremities:  Without edema. Neurologic:  Alert and  oriented x4 Psych:  Alert and cooperative. Normal mood and affect.  ASSESSMENT: Melissa Mcknight is a 20 y.o. female presenting today with newly diagnosed celiac disease in March 2022 via serologies and confirmed duodenal biopsies, doing remarkably with resolution of abdominal pain and diarrhea with strict gluten avoidance.  We discussed checking labs today including CBC, B12, folate, iron studies, Vit D. Will also recheck TTg, IgA for monitoring response to gluten-free diet. We also discussed a baseline DEXA scan, which she would like to pursue.   PLAN:  CBC, B12, folate, iron studies, Vit D, CMP, TTg, IgA Continue strict gluten avoidance DEXA scan 6 month follow-up    April 2022, PhD, ANP-BC [facility]

## 2021-03-19 ENCOUNTER — Telehealth: Payer: Self-pay | Admitting: Gastroenterology

## 2021-03-19 ENCOUNTER — Encounter: Payer: Self-pay | Admitting: Gastroenterology

## 2021-03-19 NOTE — Telephone Encounter (Signed)
[facility] clinical pool: please arrange DEXA scan for patient. Diagnosis: celiac disease.

## 2021-03-20 ENCOUNTER — Other Ambulatory Visit: Payer: Self-pay

## 2021-03-20 DIAGNOSIS — K900 Celiac disease: Secondary | ICD-10-CM

## 2021-03-20 NOTE — Progress Notes (Unsigned)
dex

## 2021-03-20 NOTE — Telephone Encounter (Signed)
No PA needed for Dexa scan per [facility] website.  Dexa scan scheduled for 03/27/21 at 3:00pm.  Tried to call pt, LMOVM to inform her of Dexa appt. Mychart message sent.

## 2021-03-21 LAB — CBC WITH DIFFERENTIAL/PLATELET
Absolute Monocytes: 837 cells/uL (ref 200–950)
Basophils Absolute: 47 cells/uL (ref 0–200)
Basophils Relative: 0.5 %
Eosinophils Absolute: 263 cells/uL (ref 15–500)
Eosinophils Relative: 2.8 %
HCT: 45.5 % — ABNORMAL HIGH (ref 35.0–45.0)
Hemoglobin: 15.3 g/dL (ref 11.7–15.5)
Lymphs Abs: 1861 cells/uL (ref 850–3900)
MCH: 30.5 pg (ref 27.0–33.0)
MCHC: 33.6 g/dL (ref 32.0–36.0)
MCV: 90.8 fL (ref 80.0–100.0)
MPV: 10.4 fL (ref 7.5–12.5)
Monocytes Relative: 8.9 %
Neutro Abs: 6392 cells/uL (ref 1500–7800)
Neutrophils Relative %: 68 %
Platelets: 288 10*3/uL (ref 140–400)
RBC: 5.01 10*6/uL (ref 3.80–5.10)
RDW: 12.4 % (ref 11.0–15.0)
Total Lymphocyte: 19.8 %
WBC: 9.4 10*3/uL (ref 3.8–10.8)

## 2021-03-21 LAB — COMPLETE METABOLIC PANEL WITH GFR
AG Ratio: 1.7 (calc) (ref 1.0–2.5)
ALT: 13 U/L (ref 6–29)
AST: 21 U/L (ref 10–30)
Albumin: 4.3 g/dL (ref 3.6–5.1)
Alkaline phosphatase (APISO): 68 U/L (ref 31–125)
BUN: 12 mg/dL (ref 7–25)
CO2: 27 mmol/L (ref 20–32)
Calcium: 9.5 mg/dL (ref 8.6–10.2)
Chloride: 106 mmol/L (ref 98–110)
Creat: 0.93 mg/dL (ref 0.50–1.10)
GFR, Est African American: 103 mL/min/{1.73_m2} (ref >=60)
GFR, Est Non African American: 88 mL/min/{1.73_m2} (ref >=60)
Globulin: 2.6 g/dL (calc) (ref 1.9–3.7)
Glucose, Bld: 74 mg/dL (ref 65–99)
Potassium: 4.2 mmol/L (ref 3.5–5.3)
Sodium: 141 mmol/L (ref 135–146)
Total Bilirubin: 0.4 mg/dL (ref 0.2–1.2)
Total Protein: 6.9 g/dL (ref 6.1–8.1)

## 2021-03-21 LAB — IRON,TIBC AND FERRITIN PANEL
%SAT: 36 % (calc) (ref 16–45)
Ferritin: 36 ng/mL (ref 16–154)
Iron: 154 ug/dL (ref 40–190)
TIBC: 424 mcg/dL (calc) (ref 250–450)

## 2021-03-21 LAB — VITAMIN B12: Vitamin B-12: 461 pg/mL (ref 200–1100)

## 2021-03-21 LAB — TISSUE TRANSGLUTAMINASE, IGA: (tTG) Ab, IgA: 7.1 U/mL

## 2021-03-21 LAB — VITAMIN D 25 HYDROXY (VIT D DEFICIENCY, FRACTURES): Vit D, 25-Hydroxy: 37 ng/mL (ref 30–100)

## 2021-03-21 LAB — FOLATE: Folate: 15 ng/mL

## 2021-03-27 ENCOUNTER — Other Ambulatory Visit (HOSPITAL_COMMUNITY): Payer: 59

## 2021-04-01 ENCOUNTER — Other Ambulatory Visit: Payer: Self-pay

## 2021-04-01 ENCOUNTER — Ambulatory Visit (HOSPITAL_COMMUNITY)
Admission: RE | Admit: 2021-04-01 | Discharge: 2021-04-01 | Disposition: A | Discharge status: Home / Self Care | Payer: 59 | Source: Ambulatory Visit | Attending: Gastroenterology | Admitting: Gastroenterology

## 2021-04-01 DIAGNOSIS — K900 Celiac disease: Secondary | ICD-10-CM | POA: Insufficient documentation

## 2021-05-11 ENCOUNTER — Telehealth: Payer: 59 | Admitting: Family

## 2021-05-11 DIAGNOSIS — R399 Unspecified symptoms and signs involving the genitourinary system: Secondary | ICD-10-CM | POA: Diagnosis not present

## 2021-05-11 MED ORDER — CEPHALEXIN 500 MG PO CAPS: 500.0000 mg | ORAL_CAPSULE | Freq: Two times a day (BID) | ORAL | 0 refills | Status: AC

## 2021-05-11 NOTE — Progress Notes (Signed)

## 2021-05-19 ENCOUNTER — Telehealth: Payer: 59 | Admitting: Physician Assistant

## 2021-05-19 DIAGNOSIS — T3695XA Adverse effect of unspecified systemic antibiotic, initial encounter: Secondary | ICD-10-CM

## 2021-05-19 DIAGNOSIS — B379 Candidiasis, unspecified: Secondary | ICD-10-CM

## 2021-05-19 MED ORDER — FLUCONAZOLE 150 MG PO TABS
150.0000 mg | ORAL_TABLET | Freq: Once | ORAL | 0 refills | Status: AC
Start: 2021-05-19 — End: 2021-05-19

## 2021-05-19 NOTE — Progress Notes (Signed)

## 2021-07-16 ENCOUNTER — Encounter: Payer: Self-pay | Admitting: Gastroenterology

## 2021-09-24 ENCOUNTER — Ambulatory Visit: Payer: 59 | Admitting: Gastroenterology

## 2021-12-04 ENCOUNTER — Ambulatory Visit: Payer: 59 | Admitting: Gastroenterology
